# Patient Record
Sex: Female | Born: 1975 | Race: White | Hispanic: Yes | Marital: Married | Smoking: Never smoker
Health system: Southern US, Community
[De-identification: ages and names within clinical notes are randomized; demographics above are authoritative.]

## PROBLEM LIST (undated history)

## (undated) HISTORY — PX: TONSILLECTOMY: SUR1361

## (undated) HISTORY — PX: APPENDECTOMY: SHX54

---

## 2008-09-23 ENCOUNTER — Ambulatory Visit: Payer: Self-pay

## 2009-04-09 ENCOUNTER — Inpatient Hospital Stay (HOSPITAL_COMMUNITY): Admission: AD | Admit: 2009-04-09 | Discharge: 2009-04-13 | Payer: Self-pay | Admitting: Obstetrics

## 2009-04-10 ENCOUNTER — Encounter: Payer: Self-pay | Admitting: Obstetrics

## 2009-04-20 ENCOUNTER — Ambulatory Visit: Admission: RE | Admit: 2009-04-20 | Discharge: 2009-04-20 | Payer: Self-pay | Admitting: Obstetrics

## 2009-07-01 ENCOUNTER — Emergency Department (HOSPITAL_COMMUNITY): Admission: EM | Admit: 2009-07-01 | Discharge: 2009-07-02 | Payer: Self-pay | Admitting: Emergency Medicine

## 2010-03-12 ENCOUNTER — Emergency Department (HOSPITAL_COMMUNITY): Admission: EM | Admit: 2010-03-12 | Discharge: 2010-03-12 | Payer: Self-pay | Admitting: Emergency Medicine

## 2010-06-07 ENCOUNTER — Encounter: Admission: RE | Admit: 2010-06-07 | Discharge: 2010-06-07 | Payer: Self-pay | Admitting: Internal Medicine

## 2010-06-13 ENCOUNTER — Emergency Department (HOSPITAL_COMMUNITY): Admission: EM | Admit: 2010-06-13 | Discharge: 2010-06-13 | Payer: Self-pay | Admitting: Emergency Medicine

## 2010-07-19 IMAGING — US US OB COMP ADD'L GEST LESS 14 WKS
1 series · 17 of 22 positions shown · non-contrast
Comparison: none

REASON FOR EXAM: uncertain gestational age  Dr Russlan Nazirov
[HOSPITAL] [REDACTED]
COMMENTS:

[Series 1: us ob comp add'left gest less 14 wks · 17 of 22 slices shown]
[im 1/22]
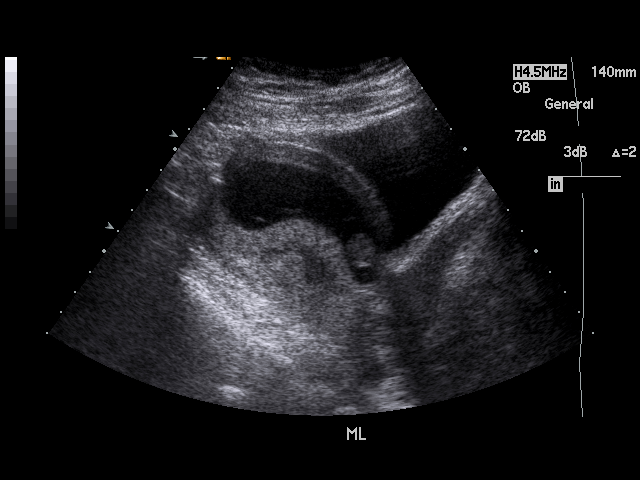
[im 2/22]
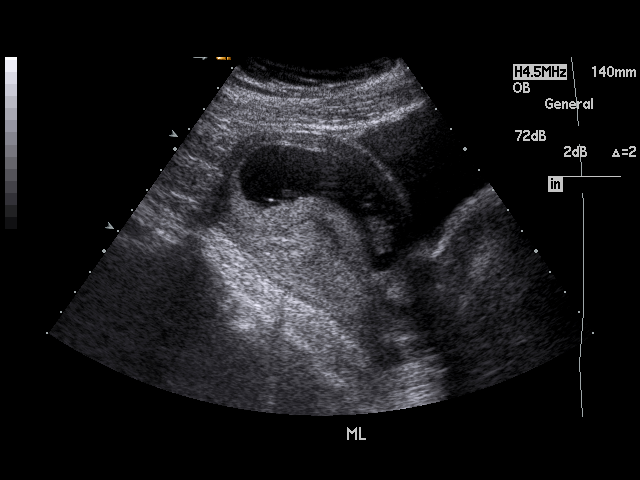
[im 4/22]
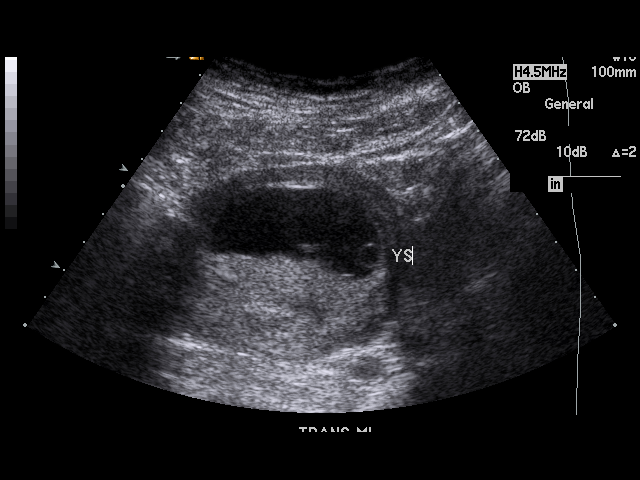
[im 5/22]
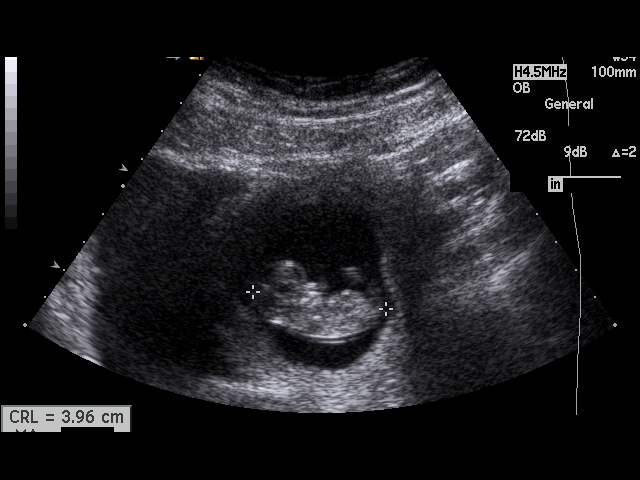
[im 6/22]
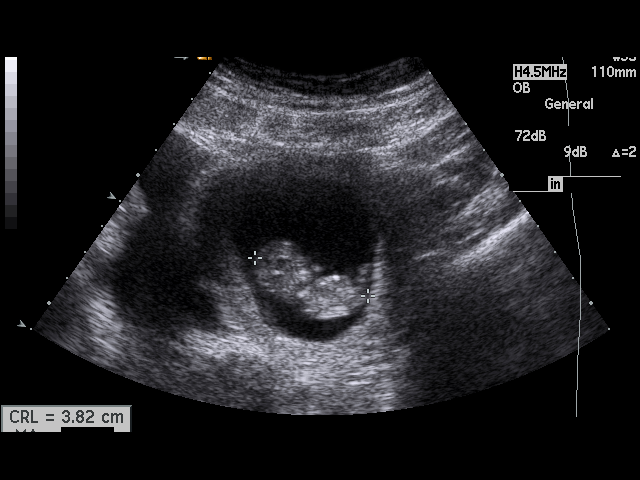
[im 8/22]
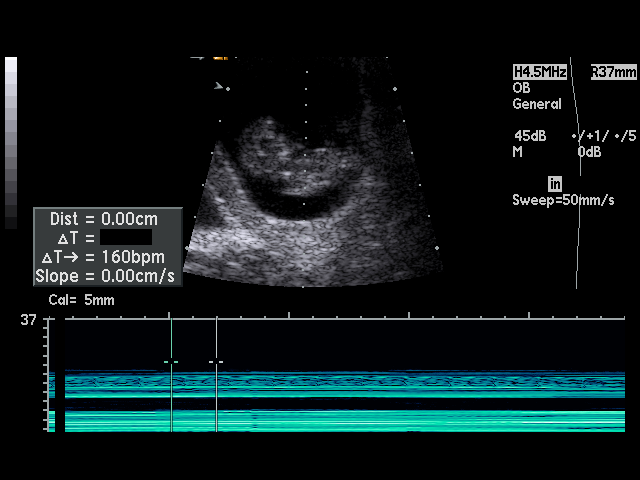
[im 9/22]
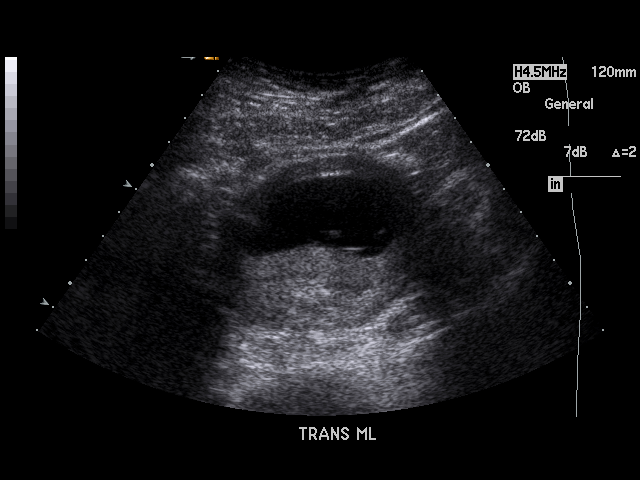
[im 10/22]
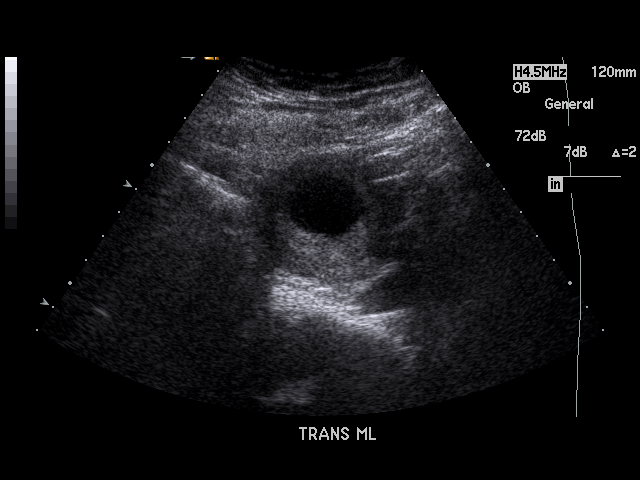
[im 12/22]
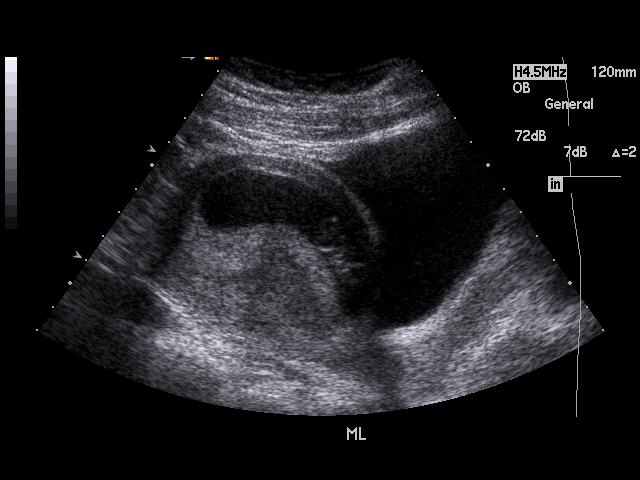
[im 13/22]
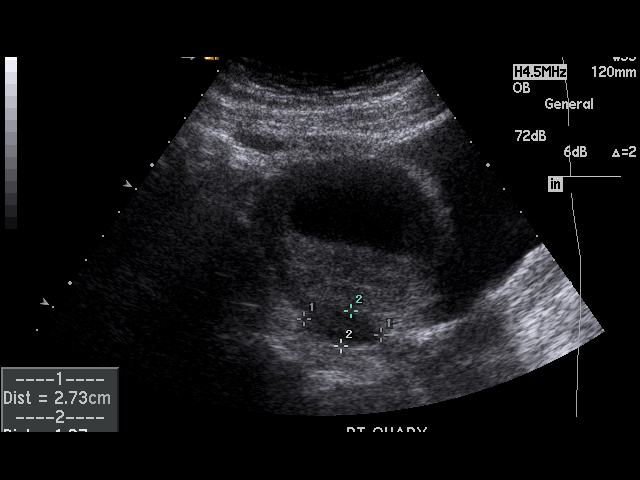
[im 14/22]
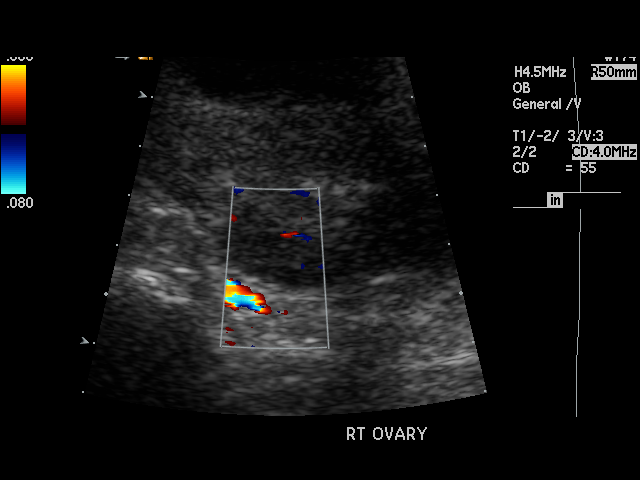
[im 15/22]
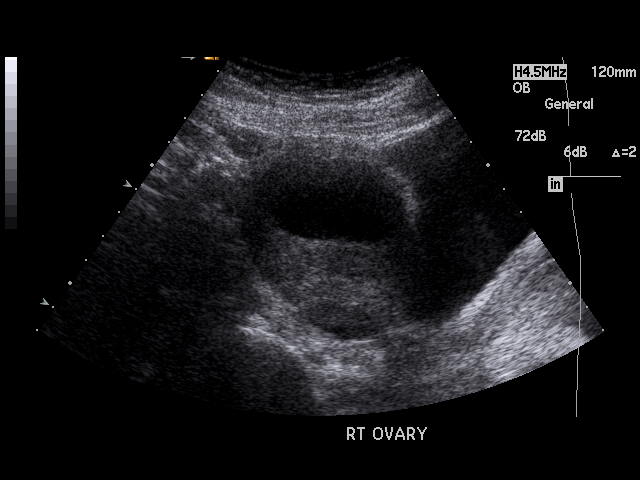
[im 17/22]
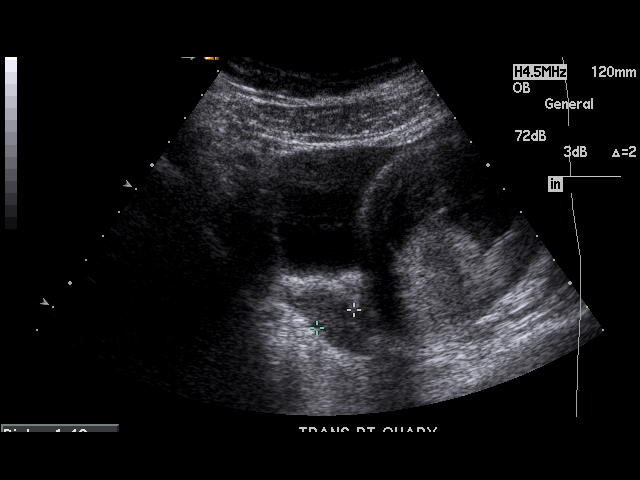
[im 18/22]
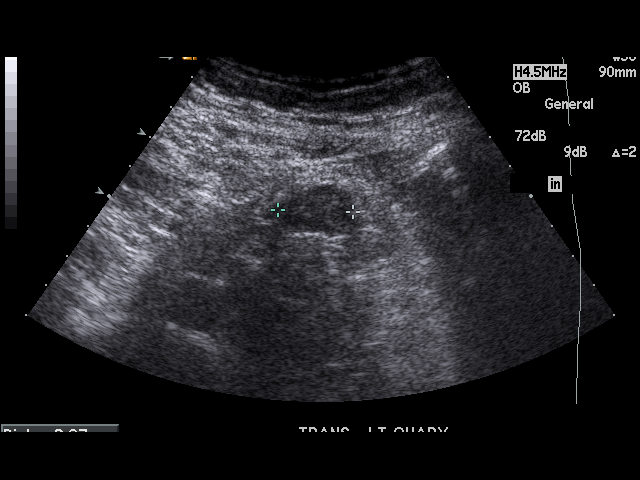
[im 19/22]
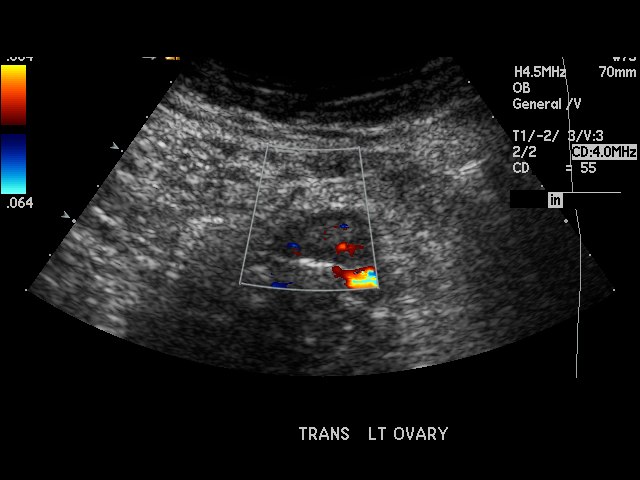
[im 21/22]
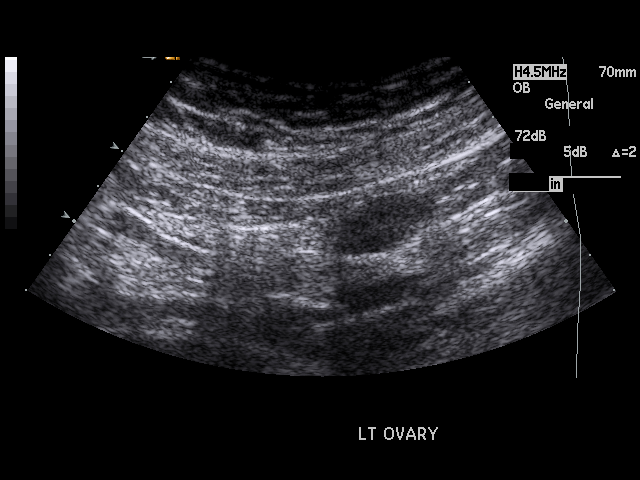
[im 22/22]
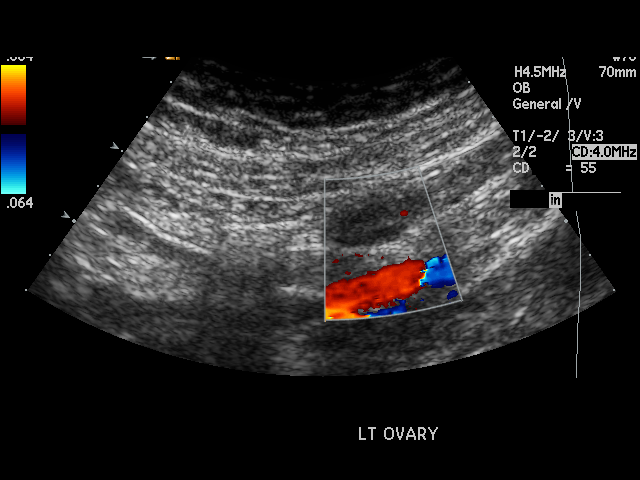

[17 of 22 positions shown; findings below may reference images not displayed]

PROCEDURE:     US  - US OB EA ADD GEST LESS THAN 14WK  - September 23, 2008  [DATE]

RESULT:     The patient is being evaluated for early IUP. The patient's last
menstrual period is unknown.

There is a viable IUP with a crown-rump length of the fetal pole measuring
3.88 cm corresponding to a 10 week-7 day gestation. Cardiac activity with a
rate of 160 beats per minute was demonstrated. A yolk sac is visible.

The maternal RIGHT ovary measures 2.7 x 1.4 x 1.3 cm. The maternal LEFT
ovary measured 2.5 x 2.1 x 1.2 cm.
IMPRESSION: There is an early IUP with an estimated gestational age of
10 weeks-1 days. This corresponds to an estimated date of confinement of 8

Thank you for the opportunity to contribute to the care of your patient.

ADDENDUM:  09-30-08

The above corrected procedure should read:

US OB-LESS THAN 14 WEEKS - September 23, 2008 [DATE]

## 2010-10-06 ENCOUNTER — Encounter: Admission: RE | Admit: 2010-10-06 | Discharge: 2010-10-06 | Payer: Self-pay | Admitting: Unknown Physician Specialty

## 2010-12-31 ENCOUNTER — Encounter: Payer: Self-pay | Admitting: Internal Medicine

## 2011-01-10 ENCOUNTER — Encounter: Payer: Self-pay | Admitting: Unknown Physician Specialty

## 2011-02-25 LAB — POCT I-STAT, CHEM 8
Chloride: 103 mEq/L (ref 96–112)
Creatinine, Ser: 0.9 mg/dL (ref 0.4–1.2)
Glucose, Bld: 117 mg/dL — ABNORMAL HIGH (ref 70–99)
Potassium: 3.3 mEq/L — ABNORMAL LOW (ref 3.5–5.1)
Sodium: 139 mEq/L (ref 135–145)

## 2011-02-25 LAB — DIFFERENTIAL
Lymphocytes Relative: 27 % (ref 12–46)
Lymphs Abs: 3.5 10*3/uL (ref 0.7–4.0)
Monocytes Relative: 3 % (ref 3–12)
Neutro Abs: 8.7 10*3/uL — ABNORMAL HIGH (ref 1.7–7.7)
Neutrophils Relative %: 68 % (ref 43–77)

## 2011-02-25 LAB — URINALYSIS, ROUTINE W REFLEX MICROSCOPIC
Ketones, ur: NEGATIVE mg/dL
Leukocytes, UA: NEGATIVE
Nitrite: NEGATIVE
Protein, ur: NEGATIVE mg/dL
Urobilinogen, UA: 0.2 mg/dL (ref 0.0–1.0)
pH: 5.5 (ref 5.0–8.0)

## 2011-02-25 LAB — CBC
Hemoglobin: 14.1 g/dL (ref 12.0–15.0)
RBC: 4.48 MIL/uL (ref 3.87–5.11)
WBC: 12.9 10*3/uL — ABNORMAL HIGH (ref 4.0–10.5)

## 2011-02-25 LAB — WET PREP, GENITAL

## 2011-02-25 LAB — URINE MICROSCOPIC-ADD ON

## 2011-02-25 LAB — PREGNANCY, URINE: Preg Test, Ur: NEGATIVE

## 2011-02-25 LAB — GC/CHLAMYDIA PROBE AMP, GENITAL: Chlamydia, DNA Probe: NEGATIVE

## 2011-03-18 LAB — COMPREHENSIVE METABOLIC PANEL
AST: 48 U/L — ABNORMAL HIGH (ref 0–37)
BUN: 13 mg/dL (ref 6–23)
Chloride: 106 mEq/L (ref 96–112)
GFR calc non Af Amer: >60 mL/min (ref >60)
Potassium: 3.6 mEq/L (ref 3.5–5.1)
Sodium: 137 mEq/L (ref 135–145)

## 2011-03-18 LAB — CBC
HCT: 37.1 % (ref 36.0–46.0)
Hemoglobin: 12.4 g/dL (ref 12.0–15.0)
Platelets: 250 10*3/uL (ref 150–400)
RDW: 14.3 % (ref 11.5–15.5)
WBC: 8.6 10*3/uL (ref 4.0–10.5)

## 2011-03-18 LAB — URINALYSIS, ROUTINE W REFLEX MICROSCOPIC
Glucose, UA: NEGATIVE mg/dL
Nitrite: NEGATIVE
Protein, ur: NEGATIVE mg/dL
pH: 6.5 (ref 5.0–8.0)

## 2011-03-18 LAB — DIFFERENTIAL
Lymphocytes Relative: 23 % (ref 12–46)
Lymphs Abs: 2 10*3/uL (ref 0.7–4.0)
Neutrophils Relative %: 71 % (ref 43–77)

## 2011-03-18 LAB — POCT PREGNANCY, URINE: Preg Test, Ur: NEGATIVE

## 2011-03-20 LAB — CBC
HCT: 37.8 % (ref 36.0–46.0)
Hemoglobin: 11.9 g/dL — ABNORMAL LOW (ref 12.0–15.0)
Hemoglobin: 13.4 g/dL (ref 12.0–15.0)
MCHC: 35.6 g/dL (ref 30.0–36.0)
MCV: 90.2 fL (ref 78.0–100.0)
RBC: 3.69 MIL/uL — ABNORMAL LOW (ref 3.87–5.11)
WBC: 10.8 10*3/uL — ABNORMAL HIGH (ref 4.0–10.5)

## 2011-04-24 NOTE — Op Note (Signed)
Amber Anthony, Amber Anthony            ACCOUNT NO.:  [id number]   MEDICAL RECORD NO.:  [id number]          PATIENT TYPE:  INP   LOCATION:  9140                          FACILITY:  [facility]   PHYSICIAN:  Charles A. Clearance Coots, M.D.DATE OF BIRTH:  [birth year]   DATE OF PROCEDURE:  04/10/2009  DATE OF DISCHARGE:                               OPERATIVE REPORT   PREOPERATIVE DIAGNOSIS:  Arrest of descent.   POSTOPERATIVE DIAGNOSIS:  Arrest of descent.   PROCEDURE:  Primary low transverse cesarean section.   SURGEON:  Charles A. Clearance Coots, MD   ANESTHESIA:  Epidural.   ESTIMATED BLOOD LOSS:  700 mL.   IV FLUIDS:  2300 mL.   URINE OUTPUT:  150 mL, clear.   COMPLICATIONS:  None.   Foley to gravity.   FINDINGS:  Viable female at 2301, Apgars of 9 at 1 minute and 9 at 5  minutes, weight of 9 pounds and 10 ounces.  Normal uterus, ovaries, and  fallopian tubes.   OPERATION:  The patient was brought to the operating room and after  satisfactory re-dosing of the epidural, the abdomen was prepped and  draped in the usual sterile fashion.  A Pfannenstiel skin incision was  made with a scalpel that was deepened down to the fascia with the  scalpel.  Fascia was nicked with the scalpel and fascial incision was  extended to the left and to the right with curved Mayo scissors.  The  superior and inferior fascial edges were taken off the rectus muscles  with both blunt and sharp dissection.  Rectus muscle was then bluntly  and sharply divided in the midline.  Peritoneum was entered digitally  and was digitally extended to the left and to the right.  The bladder  blade was positioned in the incision and the vesicouterine fold of  peritoneum above the reflection of urinary bladder was grasped with  forceps and was incised and undermined with Metzenbaum scissors.  The  incision was extended to the left and to the right with Metzenbaum  scissors.  Bladder flap was then bluntly developed and the bladder blade  was repositioned in front of the urinary bladder, placing it well out of  the operative field.  The uterus was then entered transversely in the  lower uterine segment with a scalpel.  Clear amniotic fluid was  expelled.  The uterine incision was extended to left and to the right  with the bandage scissors in a small configuration.  The occiput was  noted to be in the posterior position and the occiput was rotated  anteriorly into the incision and could not be completely delivered with  the aid of fundal pressure from the assistant.  The Mushroom Mityvac was  then applied to the occiput and the occiput was then fully extended into  the incision and delivered with 1 pull of the vacuum extractor with the  aid of fundal pressure from the assistant.  The infant's mouth and nose  were suctioned with a suction bulb and the delivery was completed with  the aid of fundal pressure from the assistant.  The umbilical cord was  doubly clamped and cut, and the infant was handed off to the nursery  staff.  The placenta was then spontaneously expelled from the uterine  cavity intact.  The endometrial surface was then thoroughly debrided  with a dry lap sponge.  The edges of the uterine incision were grasped  with ring forceps and the uterus was closed with a continuous  interlocking suture of 0 Monocryl from each corner to the center.  Hemostasis was excellent.  The pelvic cavity was then thoroughly  irrigated with warm saline solution and all clots were removed.  The  abdomen was then closed as follows; the parietal peritoneum was closed  with a continuous suture of 2-0 Vicryl.  The fascia was closed with a  continuous suture of 0 Vicryl.  The subcutaneous tissue was thoroughly  irrigated with warm saline solution and all areas of subcutaneous  bleeding were coagulated with the Bovie.  Skin was then approximated  with continuous subcuticular suture of 4-0 Monocryl.  Sterile bandage  was applied to the  incision closure.  Surgical technician indicated that  all needle, sponge, and instrument counts were correct x2.  The patient  tolerated the procedure well and transported to the recovery room in  satisfactory condition.      Charles A. Clearance Coots, M.D.  Electronically Signed     CAH/MEDQ  D:  04/11/2009  T:  04/11/2009  Job:  [id number]

## 2011-04-24 NOTE — Discharge Summary (Signed)
Amber Anthony, Amber Anthony            ACCOUNT NO.:  [id number]   MEDICAL RECORD NO.:  [id number]          PATIENT TYPE:  INP   LOCATION:  9103                          FACILITY:  [facility]   PHYSICIAN:  Charles A. Clearance Coots, M.D.DATE OF BIRTH:  1976/03/08   DATE OF ADMISSION:  04/09/2009  DATE OF DISCHARGE:  04/13/2009                               DISCHARGE SUMMARY   ADMITTING DIAGNOSIS:  A [redacted] weeks gestation and elective induction of  labor for suspected large for gestational age fetus.   DISCHARGE DIAGNOSES:  A [redacted] weeks gestation in elective induction of  labor for suspected large for gestational age fetus.   Status post primary low transverse cesarean section on Apr 10, 2009 for  arrest of dilatation, viable female delivered at 2301.  Apgars of 9 at 1  minute,  9 at 5 minutes, weight of 9 pounds 10 ounces.  Mother and  infant discharged home in good condition.   REASON FOR ADMISSION:  A 35 year old G2, P0 estimated date of  confinement of Apr 16, 2009, presents for induction of labor for  suspected LGA fetus.   PAST MEDICAL HISTORY:   PAST SURGICAL HISTORY:  Appendectomy and tonsils.   ILLNESSES:  Depression.   MEDICATIONS:  Prenatal vitamins.   ALLERGIES:  No known drug allergies.   GYN HISTORY:  Significant for genital herpes.  Pap smear was within  normal.   OBSTETRICAL HISTORY:  Significant for previous abortion.   SOCIAL HISTORY:  Married.  Negative tobacco, alcohol, or recreational  drug use.   FAMILY HISTORY:  Significant for diabetes, depression, and hypertension.   PHYSICAL EXAMINATION:  GENERAL:  Well nourished and well developed  female, in no acute distress.  VITAL SIGNS:  Afebrile.  Vital signs are stable.  LUNGS:  Clear to auscultation bilaterally.  HEART:  Regular rate and rhythm.  ABDOMEN:  Gravid and nontender.  Cervix closed, 50% effaced.  Vertex at  -3 station.   A 22-French Foley was inserted with the above pass the internal os of  the cervix and the bulb  was inflated with 60 mL of lactated Ringer's,  routine fashion.   ADMITTING LABORATORY DATA:  Hemoglobin 13, hematocrit 37, white blood  cell count 10,800, and platelets 235,000.  RPR was nonreactive.   HOSPITAL COURSE:  The patient was admitted and underwent Foley bulb,  cervical ripening and had no significant.  The bulb did not come out  after 12 hours and the Foley was therefore removed and the Cervidil was  placed.  Cervidil was removed after 12 hours and cervix was 5 cm and 80%  effaced and vertex at -1 station.  The patient progressed to 7-8 cm  dilatation and then made no further progress with either dilatation or  descent for greater than 4 hours.  A decision was made to proceed with  cesarean section delivery for arrest of dilatation and suspected large  for gestational age fetus with occiput posterior position.  Primary low  transverse cesarean section was performed on Apr 10, 2009.  There were no  intraoperative complications.  Postoperative course was uncomplicated.  The patient was  discharged home on postop day #3 in good condition.   DISCHARGE LABORATORY DATA:  Hemoglobin 11.9, hematocrit 33.3, white  blood cell count 16,900, and platelets 180,000.   DISCHARGE DISPOSITION:   MEDICATIONS:  Continue prenatal vitamins.  Percocet and ibuprofen was  prescribed for pain.   DISCHARGE INSTRUCTIONS:  Routine written instructions were given for  discharge after cesarean section.  The patient is to call the office for  followup appointment in 2 weeks.      Charles A. Clearance Coots, M.D.  Electronically Signed     CAH/MEDQ  D:  04/13/2009  T:  04/13/2009  Job:  [id number]

## 2011-11-17 ENCOUNTER — Encounter: Payer: Self-pay | Admitting: *Deleted

## 2011-11-17 ENCOUNTER — Emergency Department (HOSPITAL_COMMUNITY)
Admission: EM | Admit: 2011-11-17 | Discharge: 2011-11-17 | Disposition: A | Discharge status: Home / Self Care | Attending: Emergency Medicine | Admitting: Emergency Medicine

## 2011-11-17 DIAGNOSIS — R51 Headache: Secondary | ICD-10-CM | POA: Insufficient documentation

## 2011-11-17 DIAGNOSIS — R509 Fever, unspecified: Secondary | ICD-10-CM | POA: Insufficient documentation

## 2011-11-17 DIAGNOSIS — R059 Cough, unspecified: Secondary | ICD-10-CM | POA: Insufficient documentation

## 2011-11-17 DIAGNOSIS — R05 Cough: Secondary | ICD-10-CM | POA: Insufficient documentation

## 2011-11-17 DIAGNOSIS — IMO0001 Reserved for inherently not codable concepts without codable children: Secondary | ICD-10-CM | POA: Insufficient documentation

## 2011-11-17 DIAGNOSIS — J329 Chronic sinusitis, unspecified: Secondary | ICD-10-CM

## 2011-11-17 MED ORDER — AZITHROMYCIN 250 MG PO TABS: ORAL_TABLET | ORAL | Status: AC

## 2011-11-17 MED ORDER — HYDROCOD POLST-CHLORPHEN POLST 10-8 MG/5ML PO LQCR: 5.0000 mL | Freq: Two times a day (BID) | ORAL | Status: DC | PRN

## 2011-11-17 NOTE — ED Notes (Signed)
URI symptoms since Thanksgiving, fever, headache, cough, not feeling well

## 2011-11-17 NOTE — ED Provider Notes (Signed)
Medical screening examination/treatment/procedure(s) were performed by non-physician practitioner and as supervising physician I was immediately available for consultation/collaboration.   Geoffery Lyons, MD 11/17/11 [phone]

## 2011-11-17 NOTE — ED Provider Notes (Signed)
History     CSN: [id number] Arrival date & time: 11/17/2011 10:52 AM   First MD Initiated Contact with Patient 11/17/11 1240      Chief Complaint  Patient presents with  . URI  . Fever  . Sore Throat  . Cough    (Consider location/radiation/quality/duration/timing/severity/associated sxs/prior treatment) HPI Comments: Patient with onset of headache, fever, cough, nasal congestion, ear congestion for several days approximately 2 weeks ago. Patient felt somewhat better in the interim but is now redeveloped symptoms and feels that once again. She thinks she has had a low-grade temperature. Has been treating with over-the-counter Tylenol.  Patient is a 35 y.o. female presenting with URI, fever, pharyngitis, and cough. The history is provided by the patient.  URI The primary symptoms include fever, fatigue, headaches, ear pain, sore throat, cough and myalgias. Primary symptoms do not include wheezing, abdominal pain, nausea, vomiting or rash. The current episode started more than 1 week ago. The problem has not changed since onset. Symptoms associated with the illness include plugged ear sensation, sinus pressure, congestion and rhinorrhea. The illness is not associated with chills.  Fever Primary symptoms of the febrile illness include fever, fatigue, headaches, cough and myalgias. Primary symptoms do not include wheezing, shortness of breath, abdominal pain, nausea, vomiting, diarrhea, dysuria or rash.  Sore Throat Associated symptoms include congestion, coughing, fatigue, a fever, headaches, myalgias and a sore throat. Pertinent negatives include no abdominal pain, chest pain, chills, nausea, rash or vomiting.  Cough Associated symptoms include ear pain, headaches, rhinorrhea, sore throat and myalgias. Pertinent negatives include no chest pain, no chills, no shortness of breath and no wheezing.    History reviewed. No pertinent past medical history.  Past Surgical History  Procedure  Date  . Tonsillectomy   . Appendectomy     No family history on file.  History  Substance Use Topics  . Smoking status: Never Smoker   . Smokeless tobacco: Not on file  . Alcohol Use: Yes     social    OB History    Grav Para Term Preterm Abortions TAB SAB Ect Mult Living                  Review of Systems  Constitutional: Positive for fever and fatigue. Negative for chills.  HENT: Positive for ear pain, congestion, sore throat, rhinorrhea and sinus pressure.   Eyes: Negative for discharge.  Respiratory: Positive for cough. Negative for shortness of breath and wheezing.   Cardiovascular: Negative for chest pain.  Gastrointestinal: Negative for nausea, vomiting, abdominal pain, diarrhea and constipation.  Genitourinary: Negative for dysuria.  Musculoskeletal: Positive for myalgias.  Skin: Negative for rash.  Neurological: Positive for headaches.  Psychiatric/Behavioral: Negative for confusion.    Allergies  Review of patient's allergies indicates no known allergies.  Home Medications   Current Outpatient Rx  Name Route Sig Dispense Refill  . ACETAMINOPHEN 500 MG PO TABS Oral Take 500 mg by mouth every 6 (six) hours as needed. pain     . NORETHIN-ETH ESTRAD TRIPHASIC 0.5/0.75/1-35 MG-MCG PO TABS Oral Take 1 tablet by mouth daily.        BP 117/77  Pulse 78  Temp 99.2 F (37.3 C)  Resp 18  SpO2 100%  LMP 11/03/2011  Physical Exam  Nursing note and vitals reviewed. Constitutional: She is oriented to person, place, and time. She appears well-developed and well-nourished.  HENT:  Head: Normocephalic and atraumatic.  Right Ear: Tympanic membrane and external ear normal.  Left Ear: Tympanic membrane and external ear normal.  Nose: Mucosal edema present. Right sinus exhibits frontal sinus tenderness. Left sinus exhibits frontal sinus tenderness.  Eyes: Pupils are equal, round, and reactive to light. Right eye exhibits no discharge. Left eye exhibits no discharge.    Neck: Normal range of motion. Neck supple.  Cardiovascular: Normal rate and regular rhythm.  Exam reveals no gallop and no friction rub.   No murmur heard. Pulmonary/Chest: Effort normal and breath sounds normal. No respiratory distress. She has no wheezes.  Abdominal: Soft. There is no tenderness. There is no rebound and no guarding.  Musculoskeletal: Normal range of motion.  Neurological: She is alert and oriented to person, place, and time.  Skin: Skin is warm and dry. No rash noted.  Psychiatric: She has a normal mood and affect.    ED Course  Procedures (including critical care time)  Labs Reviewed - No data to display No results found.   1. Sinusitis    Patient counseled on supportive care for URI/sinusitis and s/s to return including worsening symptoms, persistent fever, persistent vomiting, or if they have any other concerns.  Urged to see PCP if symptoms persist for more than 3 days. Patient verbalizes understanding and agrees with plan.   Patient counseled on use of narcotic pain medications. Counseled not to combine these medications with others containing tylenol. Urged not to drink alcohol, drive, or perform any other activities that requires focus while taking these medications. The patient verbalizes understanding and agrees with the plan.   MDM  Patient with symptoms most likely consistent with sinusitis. Will prescribe antibiotics given worsening of symptoms after initial improvement so suspicion for bacterial etiology. Patient otherwise appears well and is stable for discharge home.        Carolee Rota, [place] 11/17/11 1640

## 2012-03-13 ENCOUNTER — Emergency Department (HOSPITAL_COMMUNITY)
Admission: EM | Admit: 2012-03-13 | Discharge: 2012-03-13 | Disposition: A | Discharge status: Home / Self Care | Attending: Emergency Medicine | Admitting: Emergency Medicine

## 2012-03-13 ENCOUNTER — Encounter (HOSPITAL_COMMUNITY): Payer: Self-pay | Admitting: Emergency Medicine

## 2012-03-13 DIAGNOSIS — M5416 Radiculopathy, lumbar region: Secondary | ICD-10-CM

## 2012-03-13 DIAGNOSIS — F419 Anxiety disorder, unspecified: Secondary | ICD-10-CM

## 2012-03-13 DIAGNOSIS — IMO0002 Reserved for concepts with insufficient information to code with codable children: Secondary | ICD-10-CM | POA: Insufficient documentation

## 2012-03-13 DIAGNOSIS — M545 Low back pain, unspecified: Secondary | ICD-10-CM | POA: Insufficient documentation

## 2012-03-13 DIAGNOSIS — M542 Cervicalgia: Secondary | ICD-10-CM | POA: Insufficient documentation

## 2012-03-13 DIAGNOSIS — IMO0001 Reserved for inherently not codable concepts without codable children: Secondary | ICD-10-CM | POA: Insufficient documentation

## 2012-03-13 DIAGNOSIS — F411 Generalized anxiety disorder: Secondary | ICD-10-CM | POA: Insufficient documentation

## 2012-03-13 DIAGNOSIS — R209 Unspecified disturbances of skin sensation: Secondary | ICD-10-CM | POA: Insufficient documentation

## 2012-03-13 DIAGNOSIS — M62838 Other muscle spasm: Secondary | ICD-10-CM | POA: Insufficient documentation

## 2012-03-13 MED ORDER — DIAZEPAM 5 MG PO TABS
5.0000 mg | ORAL_TABLET | Freq: Once | ORAL | Status: AC
  Administered 2012-03-13: 5 mg via ORAL
  Filled 2012-03-13: qty 1

## 2012-03-13 MED ORDER — OXYCODONE-ACETAMINOPHEN 5-325 MG PO TABS
1.0000 | ORAL_TABLET | Freq: Once | ORAL | Status: AC
  Administered 2012-03-13: 1 via ORAL
  Filled 2012-03-13: qty 1

## 2012-03-13 MED ORDER — DIAZEPAM 5 MG PO TABS: 5.0000 mg | ORAL_TABLET | Freq: Three times a day (TID) | ORAL | Status: AC | PRN

## 2012-03-13 MED ORDER — TRAMADOL HCL 50 MG PO TABS: 50.0000 mg | ORAL_TABLET | Freq: Four times a day (QID) | ORAL | Status: AC | PRN

## 2012-03-13 NOTE — ED Notes (Signed)
Pt reports that she has been generalized aching and not feeling well for 2 days. She reports pain in her lower back, upper neck and "my legs feel numb". Pt had diarrhea today and has felt dizzy. Pt denies fever.

## 2012-03-13 NOTE — Discharge Instructions (Signed)
Take ibuprofen for pain. Ultram as prescribed as needed for severe pain. Take valium for muscle spasms and anxiety as prescribed. Do not drive if taking. Follow up with your doctor tomorrow.  Anxiety and Panic Attacks Your caregiver has informed you that you are having an anxiety or panic attack. There may be many forms of this. Most of the time these attacks come suddenly and without warning. They come at any time of day, including periods of sleep, and at any time of life. They may be strong and unexplained. Although panic attacks are very scary, they are physically harmless. Sometimes the cause of your anxiety is not known. Anxiety is a protective mechanism of the body in its fight or flight mechanism. Most of these perceived danger situations are actually nonphysical situations (such as anxiety over losing a job). CAUSES  The causes of an anxiety or panic attack are many. Panic attacks may occur in otherwise healthy people given a certain set of circumstances. There may be a genetic cause for panic attacks. Some medications may also have anxiety as a side effect. SYMPTOMS  Some of the most common feelings are:  Intense terror.   Dizziness, feeling faint.   Hot and cold flashes.   Fear of going crazy.   Feelings that nothing is real.   Sweating.   Shaking.   Chest pain or a fast heartbeat (palpitations).   Smothering, choking sensations.   Feelings of impending doom and that death is near.   Tingling of extremities, this may be from over-breathing.   Altered reality (derealization).   Being detached from yourself (depersonalization).  Several symptoms can be present to make up anxiety or panic attacks. DIAGNOSIS  The evaluation by your caregiver will depend on the type of symptoms you are experiencing. The diagnosis of anxiety or panic attack is made when no physical illness can be determined to be a cause of the symptoms. TREATMENT  Treatment to prevent anxiety and panic  attacks may include:  Avoidance of circumstances that cause anxiety.   Reassurance and relaxation.   Regular exercise.   Relaxation therapies, such as yoga.   Psychotherapy with a psychiatrist or therapist.   Avoidance of caffeine, alcohol and illegal drugs.   Prescribed medication.  SEEK IMMEDIATE MEDICAL CARE IF:   You experience panic attack symptoms that are different than your usual symptoms.   You have any worsening or concerning symptoms.  Document Released: 11/26/2005 Document Revised: 11/15/2011 Document Reviewed: 03/30/2010 [facility] Patient Information 2012 [place], [place].  Lumbosacral Radiculopathy Lumbosacral radiculopathy is a pinched nerve or nerves in the low back (lumbosacral area). When this happens you may have weakness in your legs and may not be able to stand on your toes. You may have pain going down into your legs. There may be difficulties with walking normally. There are many causes of this problem. Sometimes this may happen from an injury, or simply from arthritis or boney problems. It may also be caused by other illnesses such as diabetes. If there is no improvement after treatment, further studies may be done to find the exact cause. DIAGNOSIS  X-rays may be needed if the problems become long standing. Electromyograms may be done. This study is one in which the working of nerves and muscles is studied. HOME CARE INSTRUCTIONS   Applications of ice packs may be helpful. Ice can be used in a plastic bag with a towel around it to prevent frostbite to skin. This may be used every 2 hours for  20 to 30 minutes, or as needed, while awake, or as directed by your caregiver.   Only take over-the-counter or prescription medicines for pain, discomfort, or fever as directed by your caregiver.   If physical therapy was prescribed, follow your caregiver's directions.  SEEK IMMEDIATE MEDICAL CARE IF:   You have pain not controlled with medications.   You seem to be  getting worse rather than better.   You develop increasing weakness in your legs.   You develop loss of bowel or bladder control.   You have difficulty with walking or balance, or develop clumsiness in the use of your legs.   You have a fever.  MAKE SURE YOU:   Understand these instructions.   Will watch your condition.   Will get help right away if you are not doing well or get worse.  Document Released: 11/26/2005 Document Revised: 11/15/2011 Document Reviewed: 07/16/2008 [facility] Patient Information 2012 [place], [place].

## 2012-03-13 NOTE — ED Provider Notes (Signed)
History     CSN: [id number]  Arrival date & time 03/13/12  1742   First MD Initiated Contact with Patient 03/13/12 1835      No chief complaint on file.   (Consider location/radiation/quality/duration/timing/severity/associated sxs/prior treatment) The history is provided by the patient.  PT is a 36yo female, here with multiple complaints. Pt states for the last two days she has had neck pain, lower back pain, body aches, tingling in hands, states "my hands have been cold all day.  States she unable to function, feels like her face is tingling. 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 States "I cant even study." Denies any recent injuries. Hx of herniated disk in her lower back that has been giving her problems. No fever, chills, chest pain, abdominal pain.   History reviewed. No pertinent past medical history.  Past Surgical History  Procedure Date  . Tonsillectomy   . Appendectomy     No family history on file.  History  Substance Use Topics  . Smoking status: Never Smoker   . Smokeless tobacco: Not on file  . Alcohol Use: Yes     social    OB History    Grav Para Term Preterm Abortions TAB SAB Ect Mult Living                  Review of Systems  Constitutional: Positive for diaphoresis. Negative for fever and chills.  HENT: Positive for neck pain and neck stiffness. Negative for ear pain, congestion and sore throat.   Eyes: Negative.   Respiratory: Negative.   Cardiovascular: Negative.   Gastrointestinal: Negative.   Genitourinary: Negative for dysuria, frequency and flank pain.  Neurological: Positive for dizziness and numbness. Negative for weakness and headaches.  Psychiatric/Behavioral: The patient is nervous/anxious.     Allergies  Review of patient's allergies indicates no known allergies.  Home Medications   Current Outpatient Rx  Name Route Sig Dispense Refill  .  ACETAMINOPHEN 500 MG PO TABS Oral Take 1,000 mg by mouth every 6 (six) hours as needed. pain    . NORETHIN-ETH ESTRAD TRIPHASIC 0.5/0.75/1-35 MG-MCG PO TABS Oral Take 1 tablet by mouth daily.      BP 137/97  Pulse 85  Temp(Src) 97.8 F (36.6 C) (Oral)  Resp 20  SpO2 99%  Physical Exam  Nursing note and vitals reviewed. Constitutional: She is oriented to person, place, and time. She appears well-developed and well-nourished.       Very anxious  HENT:  Head: Normocephalic.  Eyes: Conjunctivae are normal.  Neck: Neck supple.  Cardiovascular: Normal rate, regular rhythm and normal heart sounds.   Pulmonary/Chest: Effort normal and breath sounds normal.       hyperventilating  Abdominal: Soft. Bowel sounds are normal. There is no tenderness.  Musculoskeletal: She exhibits no edema.       Right paracervical spine tenderness. Lumbar and para lumbar spine tenderness. NO pain with straight leg raise.   Neurological: She is alert and oriented to person, place, and time.       5/5 upper extremity and lower extremity strength. 2+ patellar reflexes, equal bilaterally. Equal grip strength bilaterally  Skin: Skin is warm and dry.  Psychiatric:       Pt appears very anxious, she is pacing in the room, hyperventilating, grabbing her hands and hair    ED Course  Procedures (including critical care time)  Pt has multiple complaints, but when talked to her a little longer, pt is obviously very anxious, has a 3yo son at home, is in nursing school, big exam tomorrow. She stated to me that it scares her to learn all the nursing stuff because she has similar symptoms. Pt also has chronic lower back problem, she is neurovascularly intact, I think she is OK to follow up for this outpatient.   No diagnosis found.    MDM          Lottie Mussel, PA 03/13/12 1930

## 2012-03-13 NOTE — ED Notes (Signed)
C/O neck and lower back pain. Onset 2 days ago. States hands and feet are cold and legs are hurting. No injury. Pt is a Consulting civil engineer at [employer].

## 2012-03-21 NOTE — ED Provider Notes (Signed)
Medical screening examination/treatment/procedure(s) were performed by non-physician practitioner and as supervising physician I was immediately available for consultation/collaboration.    Orit Sanville L Asiyah Pineau, MD 03/21/12 1632 

## 2012-04-06 ENCOUNTER — Other Ambulatory Visit: Payer: Self-pay | Admitting: Obstetrics

## 2012-05-08 ENCOUNTER — Other Ambulatory Visit: Payer: Self-pay | Admitting: Nurse Practitioner

## 2012-05-08 ENCOUNTER — Ambulatory Visit
Admission: RE | Admit: 2012-05-08 | Discharge: 2012-05-08 | Disposition: A | Discharge status: Home / Self Care | Source: Ambulatory Visit | Attending: Nurse Practitioner | Admitting: Nurse Practitioner

## 2012-05-08 DIAGNOSIS — R7611 Nonspecific reaction to tuberculin skin test without active tuberculosis: Secondary | ICD-10-CM

## 2012-05-30 ENCOUNTER — Other Ambulatory Visit: Payer: Self-pay | Admitting: Obstetrics

## 2013-06-18 ENCOUNTER — Other Ambulatory Visit: Payer: Self-pay | Admitting: Obstetrics

## 2014-03-22 ENCOUNTER — Other Ambulatory Visit: Payer: Self-pay | Admitting: Obstetrics

## 2014-04-21 ENCOUNTER — Other Ambulatory Visit: Payer: Self-pay | Admitting: *Deleted

## 2014-04-21 DIAGNOSIS — B373 Candidiasis of vulva and vagina: Secondary | ICD-10-CM

## 2014-04-21 DIAGNOSIS — B3731 Acute candidiasis of vulva and vagina: Secondary | ICD-10-CM

## 2014-04-21 MED ORDER — TERCONAZOLE 0.4 % VA CREA: 1.0000 | TOPICAL_CREAM | Freq: Every day | VAGINAL | Status: AC

## 2014-04-21 MED ORDER — FLUCONAZOLE 150 MG PO TABS
150.0000 mg | ORAL_TABLET | ORAL | Status: AC
Start: 2014-04-21 — End: ?

## 2014-09-07 ENCOUNTER — Telehealth: Payer: Self-pay | Admitting: *Deleted

## 2014-09-07 ENCOUNTER — Other Ambulatory Visit: Payer: Self-pay | Admitting: *Deleted

## 2014-09-07 DIAGNOSIS — Z3041 Encounter for surveillance of contraceptive pills: Secondary | ICD-10-CM

## 2014-09-07 MED ORDER — NORETHIN-ETH ESTRAD TRIPHASIC 0.5/0.75/1-35 MG-MCG PO TABS
ORAL_TABLET | ORAL | Status: AC
Start: 2014-09-07 — End: ?

## 2014-09-07 NOTE — Telephone Encounter (Signed)
Patient calling- she is requesting a RF of her OCP while she is in town. 3:48 Rx refilled per protocol.

## 2014-10-15 ENCOUNTER — Telehealth: Payer: Self-pay | Admitting: *Deleted

## 2014-10-15 NOTE — Telephone Encounter (Signed)
Patient called requesting a refill on Diflucan.  Attempted to contact patient, no answer and unable to leave a message.

## 2014-11-02 NOTE — Telephone Encounter (Signed)
No response from patient- Re file

## 2015-03-11 ENCOUNTER — Ambulatory Visit: Payer: Self-pay
